# Patient Record
Sex: Male | Born: 1960 | Race: Black or African American | Hispanic: No
Health system: Southern US, Community
[De-identification: ages and names within clinical notes are randomized; demographics above are authoritative.]

---

## 2004-02-01 ENCOUNTER — Emergency Department: Payer: Self-pay | Admitting: General Practice

## 2005-02-01 ENCOUNTER — Other Ambulatory Visit: Payer: Self-pay

## 2005-02-01 ENCOUNTER — Emergency Department: Payer: Self-pay | Admitting: General Practice

## 2005-02-08 ENCOUNTER — Emergency Department: Payer: Self-pay | Admitting: Unknown Physician Specialty

## 2005-04-21 ENCOUNTER — Emergency Department: Payer: Self-pay | Admitting: Unknown Physician Specialty

## 2005-07-03 ENCOUNTER — Emergency Department: Payer: Self-pay | Admitting: Emergency Medicine

## 2005-10-21 ENCOUNTER — Emergency Department: Payer: Self-pay | Admitting: Emergency Medicine

## 2006-05-31 ENCOUNTER — Emergency Department: Payer: Self-pay | Admitting: Emergency Medicine
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
## 2007-10-28 ENCOUNTER — Emergency Department (HOSPITAL_COMMUNITY): Admission: EM | Admit: 2007-10-28 | Discharge: 2007-10-28 | Payer: Self-pay | Admitting: Emergency Medicine

## 2009-02-04 ENCOUNTER — Ambulatory Visit: Payer: Self-pay

## 2009-02-09 ENCOUNTER — Emergency Department: Payer: Self-pay | Admitting: Emergency Medicine

## 2009-03-04 ENCOUNTER — Emergency Department: Payer: Self-pay | Admitting: Emergency Medicine

## 2010-06-29 ENCOUNTER — Emergency Department: Payer: Self-pay | Admitting: Emergency Medicine

## 2011-05-04 ENCOUNTER — Emergency Department: Payer: Self-pay | Admitting: Emergency Medicine

## 2011-05-04 LAB — COMPREHENSIVE METABOLIC PANEL
Anion Gap: 11 (ref 7–16)
Calcium, Total: 8.6 mg/dL (ref 8.5–10.1)
Chloride: 104 mmol/L (ref 98–107)
EGFR (African American): >60
EGFR (Non-African Amer.): >60
Osmolality: 276 (ref 275–301)
Potassium: 3.6 mmol/L (ref 3.5–5.1)
SGOT(AST): 25 U/L (ref 15–37)
Sodium: 138 mmol/L (ref 136–145)
Total Protein: 7.8 g/dL (ref 6.4–8.2)

## 2011-05-04 LAB — URINALYSIS, COMPLETE
Bacteria: NONE SEEN
Bilirubin,UR: NEGATIVE
Blood: NEGATIVE
Glucose,UR: NEGATIVE mg/dL (ref 0–75)
Ph: 5 (ref 4.5–8.0)
Specific Gravity: 1.029 (ref 1.003–1.030)
Squamous Epithelial: 1
WBC UR: 28 /HPF (ref 0–5)

## 2011-05-04 LAB — DRUG SCREEN, URINE
Amphetamines, Ur Screen: NEGATIVE (ref ?–1000)
Barbiturates, Ur Screen: NEGATIVE (ref ?–200)
Benzodiazepine, Ur Scrn: NEGATIVE (ref ?–200)
Cocaine Metabolite,Ur ~~LOC~~: NEGATIVE (ref ?–300)
MDMA (Ecstasy)Ur Screen: NEGATIVE (ref ?–500)
Methadone, Ur Screen: NEGATIVE (ref ?–300)
Tricyclic, Ur Screen: NEGATIVE (ref ?–1000)

## 2011-05-04 LAB — CBC
HCT: 49.8 % (ref 40.0–52.0)
HGB: 16.4 g/dL (ref 13.0–18.0)
MCHC: 32.9 g/dL (ref 32.0–36.0)
MCV: 88 fL (ref 80–100)
RDW: 13.2 % (ref 11.5–14.5)

## 2011-05-04 LAB — ETHANOL: Ethanol: <3 mg/dL

## 2011-05-04 LAB — SALICYLATE LEVEL: Salicylates, Serum: 2 mg/dL

## 2011-05-24 ENCOUNTER — Inpatient Hospital Stay: Payer: Self-pay | Admitting: Psychiatry

## 2011-05-24 LAB — DRUG SCREEN, URINE
Cocaine Metabolite,Ur ~~LOC~~: NEGATIVE (ref ?–300)
MDMA (Ecstasy)Ur Screen: POSITIVE (ref ?–500)
Opiate, Ur Screen: NEGATIVE (ref ?–300)
Tricyclic, Ur Screen: NEGATIVE (ref ?–1000)

## 2011-05-24 LAB — CBC
HCT: 46.6 % (ref 40.0–52.0)
HGB: 15.4 g/dL (ref 13.0–18.0)
MCHC: 32.9 g/dL (ref 32.0–36.0)
RBC: 5.34 10*6/uL (ref 4.40–5.90)
WBC: 8.6 10*3/uL (ref 3.8–10.6)

## 2011-05-24 LAB — COMPREHENSIVE METABOLIC PANEL
Alkaline Phosphatase: 48 U/L — ABNORMAL LOW (ref 50–136)
BUN: 13 mg/dL (ref 7–18)
Bilirubin,Total: 0.7 mg/dL (ref 0.2–1.0)
Calcium, Total: 8.7 mg/dL (ref 8.5–10.1)
Chloride: 105 mmol/L (ref 98–107)
Co2: 23 mmol/L (ref 21–32)
Creatinine: 0.86 mg/dL (ref 0.60–1.30)
EGFR (African American): >60
EGFR (Non-African Amer.): >60
SGPT (ALT): 30 U/L
Sodium: 142 mmol/L (ref 136–145)
Total Protein: 7.5 g/dL (ref 6.4–8.2)

## 2011-05-24 LAB — ACETAMINOPHEN LEVEL: Acetaminophen: <2 ug/mL

## 2011-05-24 LAB — ETHANOL: Ethanol %: <0.003 % (ref 0.000–0.080)

## 2011-05-24 LAB — TSH: Thyroid Stimulating Horm: 0.85 u[IU]/mL

## 2011-05-26 LAB — URINALYSIS, COMPLETE
Glucose,UR: NEGATIVE mg/dL (ref 0–75)
Leukocyte Esterase: NEGATIVE
Ph: 6 (ref 4.5–8.0)
Protein: NEGATIVE
RBC,UR: NONE SEEN /HPF (ref 0–5)
Squamous Epithelial: NONE SEEN

## 2012-06-14 LAB — COMPREHENSIVE METABOLIC PANEL
Alkaline Phosphatase: 54 U/L (ref 50–136)
Anion Gap: 7 (ref 7–16)
BUN: 15 mg/dL (ref 7–18)
Calcium, Total: 8.7 mg/dL (ref 8.5–10.1)
Chloride: 105 mmol/L (ref 98–107)
Co2: 27 mmol/L (ref 21–32)
EGFR (Non-African Amer.): >60
Osmolality: 280 (ref 275–301)
SGPT (ALT): 61 U/L (ref 12–78)
Sodium: 139 mmol/L (ref 136–145)
Total Protein: 7.8 g/dL (ref 6.4–8.2)

## 2012-06-14 LAB — URINALYSIS, COMPLETE
Ketone: NEGATIVE
Nitrite: NEGATIVE
RBC,UR: 1 /HPF (ref 0–5)
Squamous Epithelial: NONE SEEN
WBC UR: 3 /HPF (ref 0–5)

## 2012-06-14 LAB — CBC
HCT: 44.1 % (ref 40.0–52.0)
HGB: 15.2 g/dL (ref 13.0–18.0)
MCH: 30 pg (ref 26.0–34.0)
MCV: 87 fL (ref 80–100)
Platelet: 235 10*3/uL (ref 150–440)
RDW: 14.1 % (ref 11.5–14.5)
WBC: 8 10*3/uL (ref 3.8–10.6)

## 2012-06-14 LAB — DRUG SCREEN, URINE
Amphetamines, Ur Screen: NEGATIVE (ref ?–1000)
Barbiturates, Ur Screen: NEGATIVE (ref ?–200)
MDMA (Ecstasy)Ur Screen: NEGATIVE (ref ?–500)
Methadone, Ur Screen: NEGATIVE (ref ?–300)
Phencyclidine (PCP) Ur S: NEGATIVE (ref ?–25)

## 2012-06-14 LAB — ACETAMINOPHEN LEVEL: Acetaminophen: <2 ug/mL

## 2012-06-14 LAB — ETHANOL: Ethanol %: <0.003 % (ref 0.000–0.080)

## 2012-06-17 ENCOUNTER — Inpatient Hospital Stay: Payer: Self-pay | Admitting: Psychiatry

## 2012-06-18 LAB — BEHAVIORAL MEDICINE 1 PANEL
Albumin: 3.1 g/dL — ABNORMAL LOW (ref 3.4–5.0)
Alkaline Phosphatase: 48 U/L — ABNORMAL LOW (ref 50–136)
Anion Gap: 10 (ref 7–16)
BUN: 11 mg/dL (ref 7–18)
Basophil #: 0 10*3/uL (ref 0.0–0.1)
Basophil %: 0.7 %
Bilirubin,Total: 0.4 mg/dL (ref 0.2–1.0)
Calcium, Total: 7.9 mg/dL — ABNORMAL LOW (ref 8.5–10.1)
Chloride: 108 mmol/L — ABNORMAL HIGH (ref 98–107)
Co2: 24 mmol/L (ref 21–32)
Creatinine: 0.67 mg/dL (ref 0.60–1.30)
Eosinophil #: 0.2 10*3/uL (ref 0.0–0.7)
Eosinophil %: 4.8 %
Glucose: 131 mg/dL — ABNORMAL HIGH (ref 65–99)
HCT: 40.6 % (ref 40.0–52.0)
HGB: 13.6 g/dL (ref 13.0–18.0)
Lymphocyte #: 1.7 10*3/uL (ref 1.0–3.6)
Lymphocyte %: 35.9 %
MCH: 29.2 pg (ref 26.0–34.0)
MCHC: 33.4 g/dL (ref 32.0–36.0)
MCV: 87 fL (ref 80–100)
Monocyte #: 0.2 x10 3/mm (ref 0.2–1.0)
Monocyte %: 5.2 %
Neutrophil #: 2.5 10*3/uL (ref 1.4–6.5)
Neutrophil %: 53.4 %
Osmolality: 284 (ref 275–301)
Platelet: 190 10*3/uL (ref 150–440)
Potassium: 3.7 mmol/L (ref 3.5–5.1)
RBC: 4.65 10*6/uL (ref 4.40–5.90)
RDW: 14.5 % (ref 11.5–14.5)
SGOT(AST): 23 U/L (ref 15–37)
SGPT (ALT): 45 U/L (ref 12–78)
Sodium: 142 mmol/L (ref 136–145)
Thyroid Stimulating Horm: 2.8 u[IU]/mL
Total Protein: 6.3 g/dL — ABNORMAL LOW (ref 6.4–8.2)
WBC: 4.6 10*3/uL (ref 3.8–10.6)

## 2012-06-18 LAB — URINALYSIS, COMPLETE
Bacteria: NONE SEEN
Bilirubin,UR: NEGATIVE
Blood: NEGATIVE
Glucose,UR: NEGATIVE mg/dL (ref 0–75)
Ketone: NEGATIVE
Nitrite: NEGATIVE
Ph: 7 (ref 4.5–8.0)
Protein: NEGATIVE
RBC,UR: 1 /HPF (ref 0–5)
Specific Gravity: 1.016 (ref 1.003–1.030)
Squamous Epithelial: <1
WBC UR: 5 /HPF (ref 0–5)

## 2012-07-01 LAB — CBC
HCT: 46 % (ref 40.0–52.0)
MCH: 29.2 pg (ref 26.0–34.0)
MCHC: 32.9 g/dL (ref 32.0–36.0)
Platelet: 179 10*3/uL (ref 150–440)
RBC: 5.2 10*6/uL (ref 4.40–5.90)
WBC: 5.3 10*3/uL (ref 3.8–10.6)

## 2012-07-01 LAB — COMPREHENSIVE METABOLIC PANEL
Albumin: 3.8 g/dL (ref 3.4–5.0)
Alkaline Phosphatase: 49 U/L — ABNORMAL LOW (ref 50–136)
Anion Gap: 5 — ABNORMAL LOW (ref 7–16)
Bilirubin,Total: 0.4 mg/dL (ref 0.2–1.0)
Co2: 27 mmol/L (ref 21–32)
Creatinine: 0.61 mg/dL (ref 0.60–1.30)
EGFR (Non-African Amer.): >60
Potassium: 4 mmol/L (ref 3.5–5.1)
SGOT(AST): 38 U/L — ABNORMAL HIGH (ref 15–37)
SGPT (ALT): 74 U/L (ref 12–78)
Sodium: 141 mmol/L (ref 136–145)

## 2012-07-01 LAB — URINALYSIS, COMPLETE
Bacteria: NONE SEEN
Bilirubin,UR: NEGATIVE
Glucose,UR: NEGATIVE mg/dL (ref 0–75)
Ketone: NEGATIVE
Nitrite: NEGATIVE
WBC UR: 10 /HPF (ref 0–5)

## 2012-07-01 LAB — DRUG SCREEN, URINE
Amphetamines, Ur Screen: NEGATIVE (ref ?–1000)
Barbiturates, Ur Screen: NEGATIVE (ref ?–200)
Cocaine Metabolite,Ur ~~LOC~~: NEGATIVE (ref ?–300)
Phencyclidine (PCP) Ur S: NEGATIVE (ref ?–25)
Tricyclic, Ur Screen: NEGATIVE (ref ?–1000)

## 2012-07-01 LAB — ETHANOL: Ethanol: <3 mg/dL

## 2012-07-01 LAB — TSH: Thyroid Stimulating Horm: 3.14 u[IU]/mL

## 2012-07-03 ENCOUNTER — Inpatient Hospital Stay: Payer: Self-pay | Admitting: Psychiatry

## 2012-10-11 ENCOUNTER — Emergency Department: Payer: Self-pay | Admitting: Emergency Medicine

## 2012-10-12 ENCOUNTER — Emergency Department: Payer: Self-pay | Admitting: Emergency Medicine

## 2012-10-12 LAB — COMPREHENSIVE METABOLIC PANEL
Albumin: 3.2 g/dL — ABNORMAL LOW (ref 3.4–5.0)
Alkaline Phosphatase: 60 U/L (ref 50–136)
Anion Gap: 7 (ref 7–16)
Calcium, Total: 8.6 mg/dL (ref 8.5–10.1)
Chloride: 103 mmol/L (ref 98–107)
Co2: 28 mmol/L (ref 21–32)
EGFR (African American): >60
EGFR (Non-African Amer.): >60
SGOT(AST): 41 U/L — ABNORMAL HIGH (ref 15–37)
SGPT (ALT): 60 U/L (ref 12–78)
Sodium: 138 mmol/L (ref 136–145)
Total Protein: 7.5 g/dL (ref 6.4–8.2)

## 2012-10-12 LAB — CBC
HCT: 45.6 % (ref 40.0–52.0)
MCH: 29.5 pg (ref 26.0–34.0)
MCHC: 34.4 g/dL (ref 32.0–36.0)
MCV: 86 fL (ref 80–100)
Platelet: 212 10*3/uL (ref 150–440)
RDW: 12.8 % (ref 11.5–14.5)

## 2013-01-13 ENCOUNTER — Emergency Department: Payer: Self-pay | Admitting: Emergency Medicine

## 2013-01-13 LAB — CBC
HCT: 47.2 % (ref 40.0–52.0)
HGB: 16.1 g/dL (ref 13.0–18.0)
MCH: 30 pg (ref 26.0–34.0)
MCV: 88 fL (ref 80–100)
RBC: 5.35 10*6/uL (ref 4.40–5.90)
RDW: 13.5 % (ref 11.5–14.5)
WBC: 7 10*3/uL (ref 3.8–10.6)

## 2013-01-13 LAB — TSH: Thyroid Stimulating Horm: 2.63 u[IU]/mL

## 2013-01-13 LAB — URINALYSIS, COMPLETE
Blood: NEGATIVE
Ketone: NEGATIVE
Leukocyte Esterase: NEGATIVE
Nitrite: NEGATIVE
RBC,UR: 1 /HPF (ref 0–5)
Specific Gravity: 1.016 (ref 1.003–1.030)
Squamous Epithelial: NONE SEEN

## 2013-01-13 LAB — DRUG SCREEN, URINE
Barbiturates, Ur Screen: NEGATIVE (ref ?–200)
Cannabinoid 50 Ng, Ur ~~LOC~~: POSITIVE (ref ?–50)
MDMA (Ecstasy)Ur Screen: NEGATIVE (ref ?–500)
Opiate, Ur Screen: NEGATIVE (ref ?–300)
Phencyclidine (PCP) Ur S: NEGATIVE (ref ?–25)
Tricyclic, Ur Screen: NEGATIVE (ref ?–1000)

## 2013-01-13 LAB — COMPREHENSIVE METABOLIC PANEL
Albumin: 4 g/dL (ref 3.4–5.0)
Anion Gap: 7 (ref 7–16)
BUN: 14 mg/dL (ref 7–18)
Creatinine: 0.77 mg/dL (ref 0.60–1.30)
EGFR (Non-African Amer.): >60
Osmolality: 277 (ref 275–301)
Potassium: 4.2 mmol/L (ref 3.5–5.1)
Total Protein: 7.6 g/dL (ref 6.4–8.2)

## 2013-11-17 DEATH — deceased

## 2014-06-28 IMAGING — CR DG TIBIA/FIBULA 2V*L*
1 series · 4 of 4 positions shown · non-contrast
Comparison: none

REASON FOR EXAM: swelling, no injury
COMMENTS:

PROCEDURE:     DXR - DXR TIBIA AND FIBULA LT (LOWER L  - October 12, 2012  [DATE]
RESULT:

[Series 2: x tib-fib ap left · 0.14mm/px · 4 of 4 slices shown]
[im 1/4]
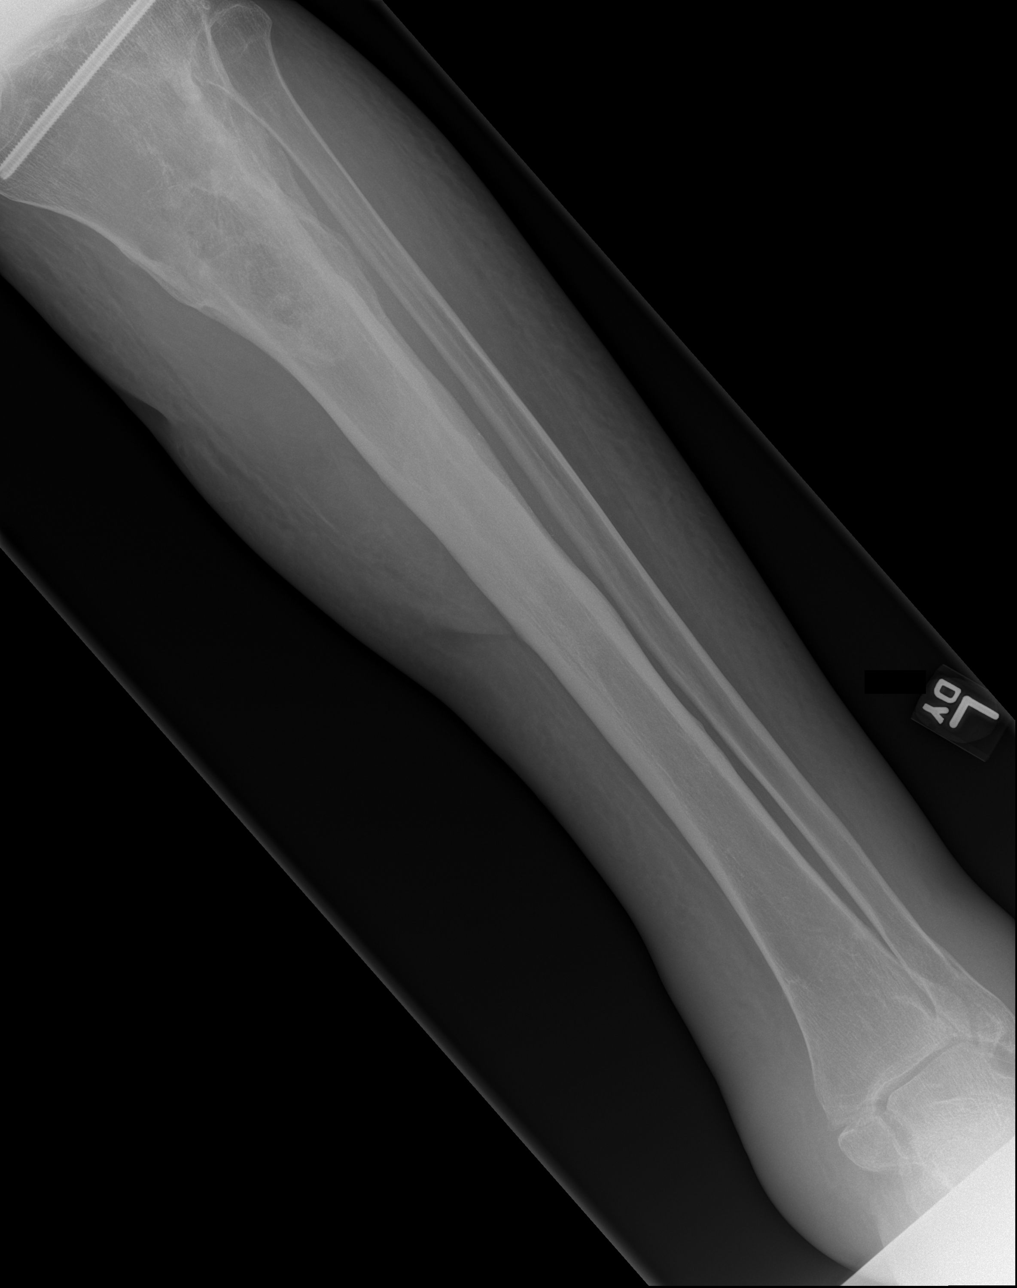
[im 2/4]
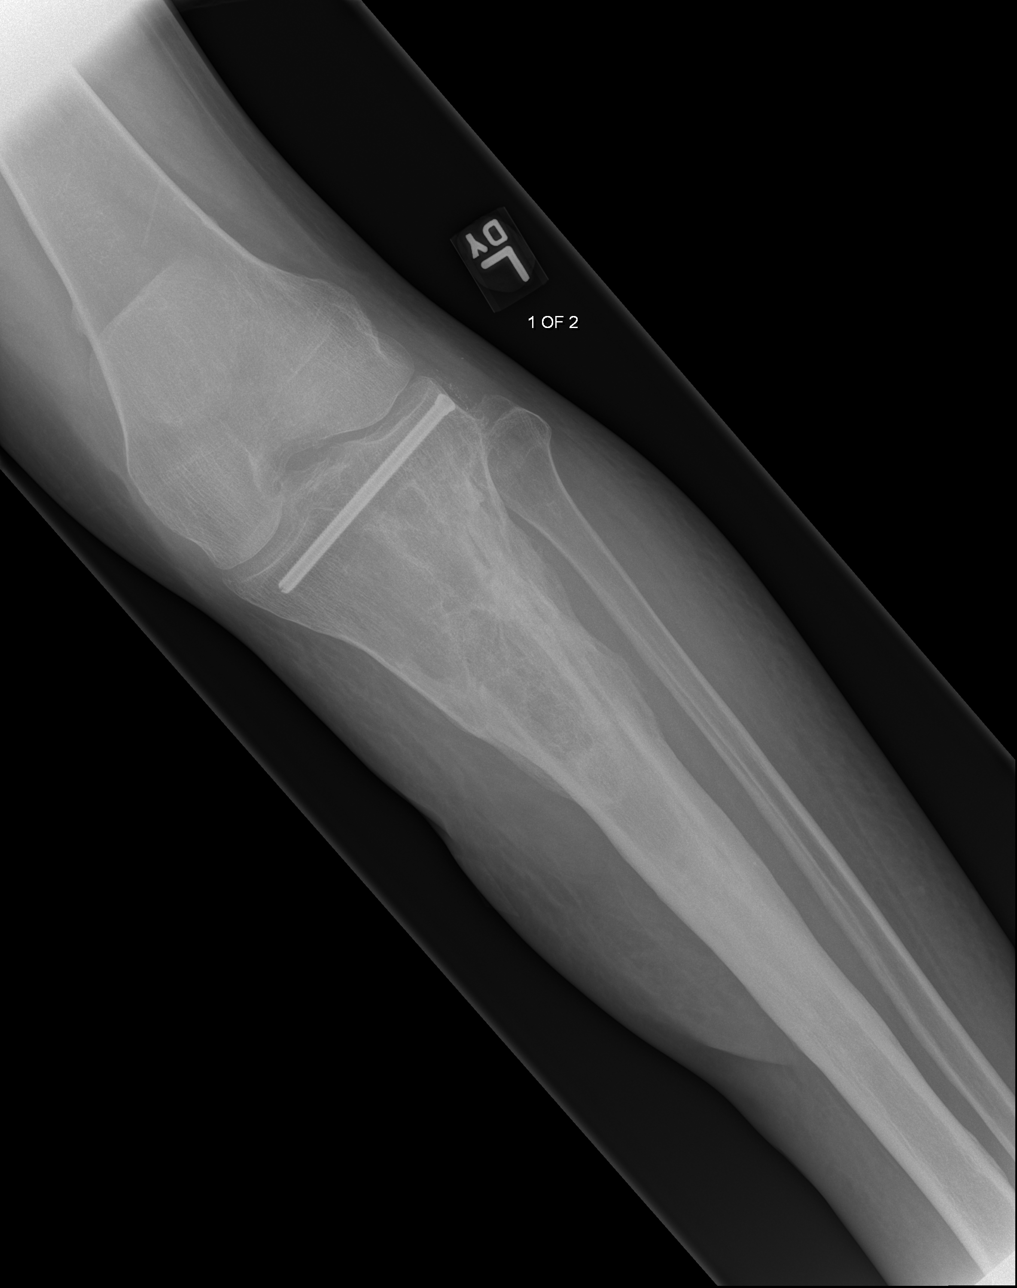
[im 3/4]
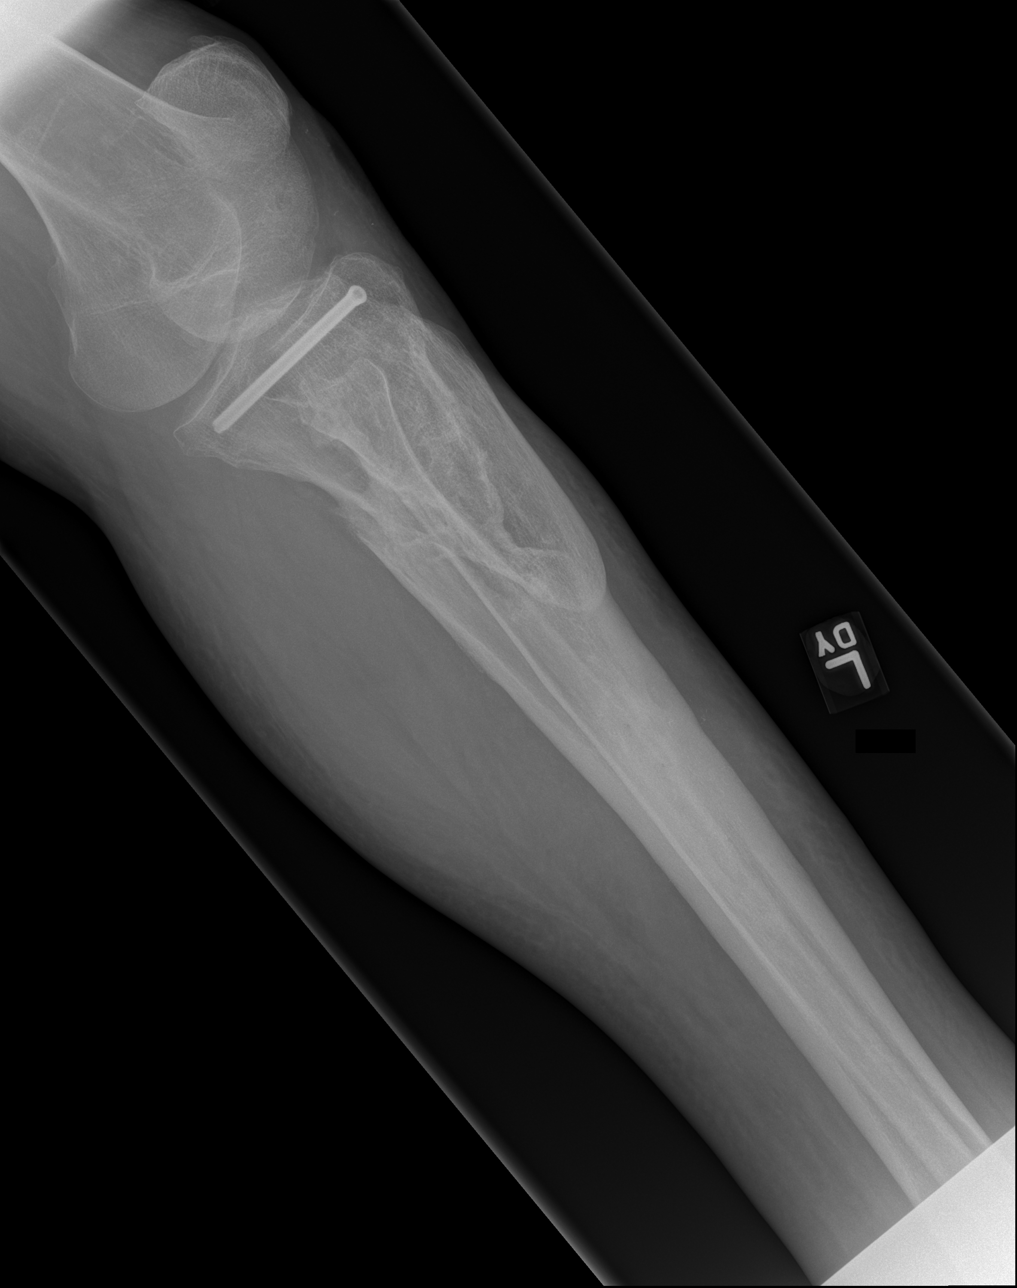
[im 4/4]
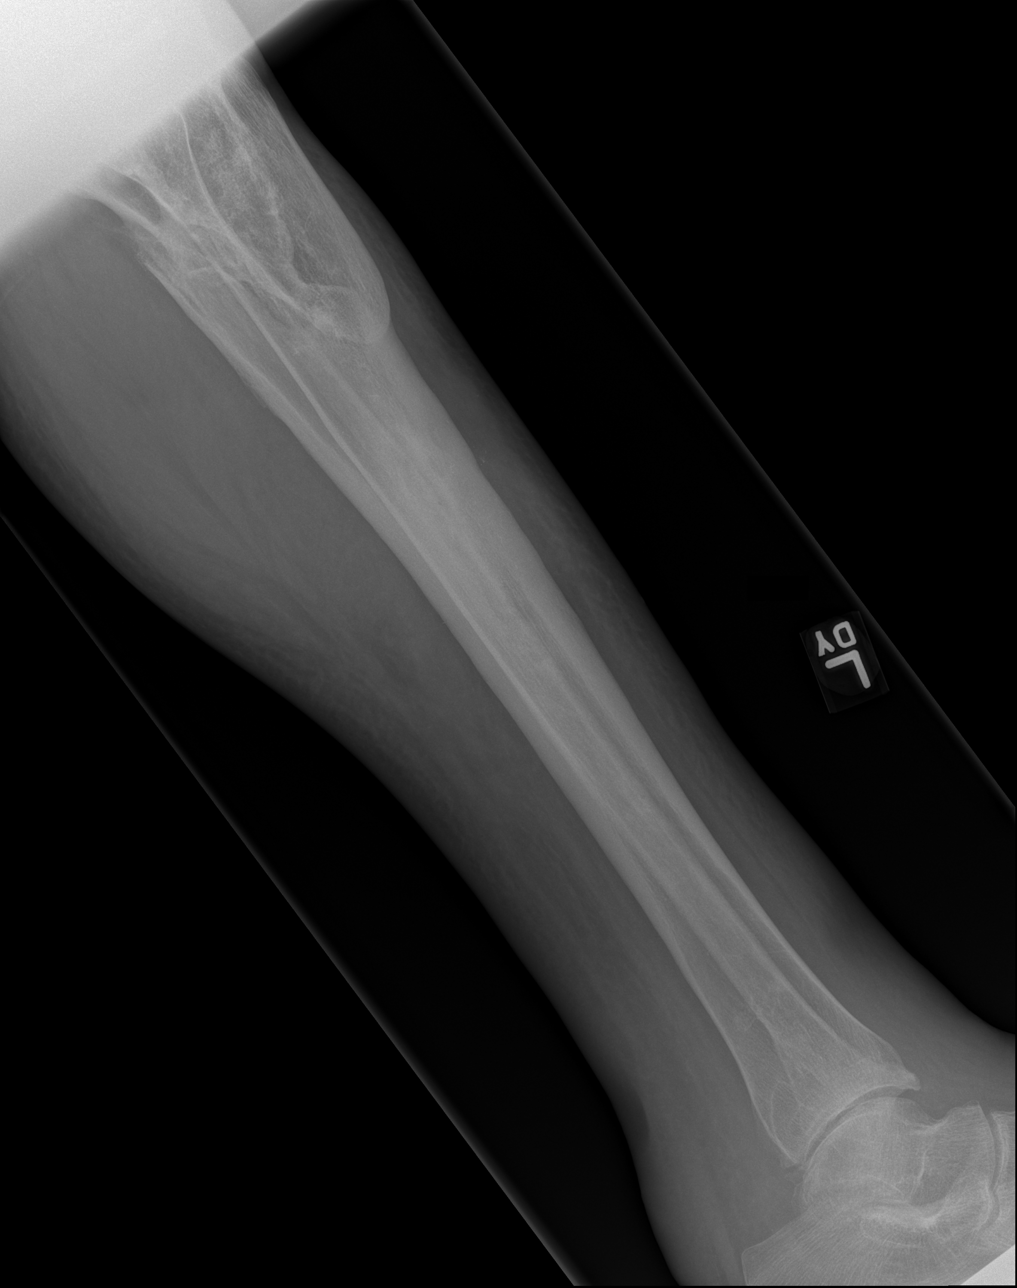

[4 of 4 positions shown; findings below may reference images not displayed]

FINDINGS: Chronic change is identified within the proximal tibia and
fibular. Chronic changes are also identified distally. An acute on chronic
fracture distally cannot be excluded. There is diffuse soft tissue swelling.
IMPRESSION: 1. Chronic changes. Acute on chronic fracture distally cannot be excluded.
Surveillance evaluation recommended.

## 2014-06-28 IMAGING — CR DG ANKLE COMPLETE 3+V*L*
1 series · 4 of 4 positions shown · non-contrast
Comparison: none

REASON FOR EXAM: swelling, no injury
COMMENTS:

PROCEDURE:     DXR - DXR ANKLE LEFT COMPLETE  - October 12, 2012  [DATE]
RESULT:

[Series 2: x ankle ap left · 0.14mm/px · 4 of 4 slices shown]
[im 1/4]
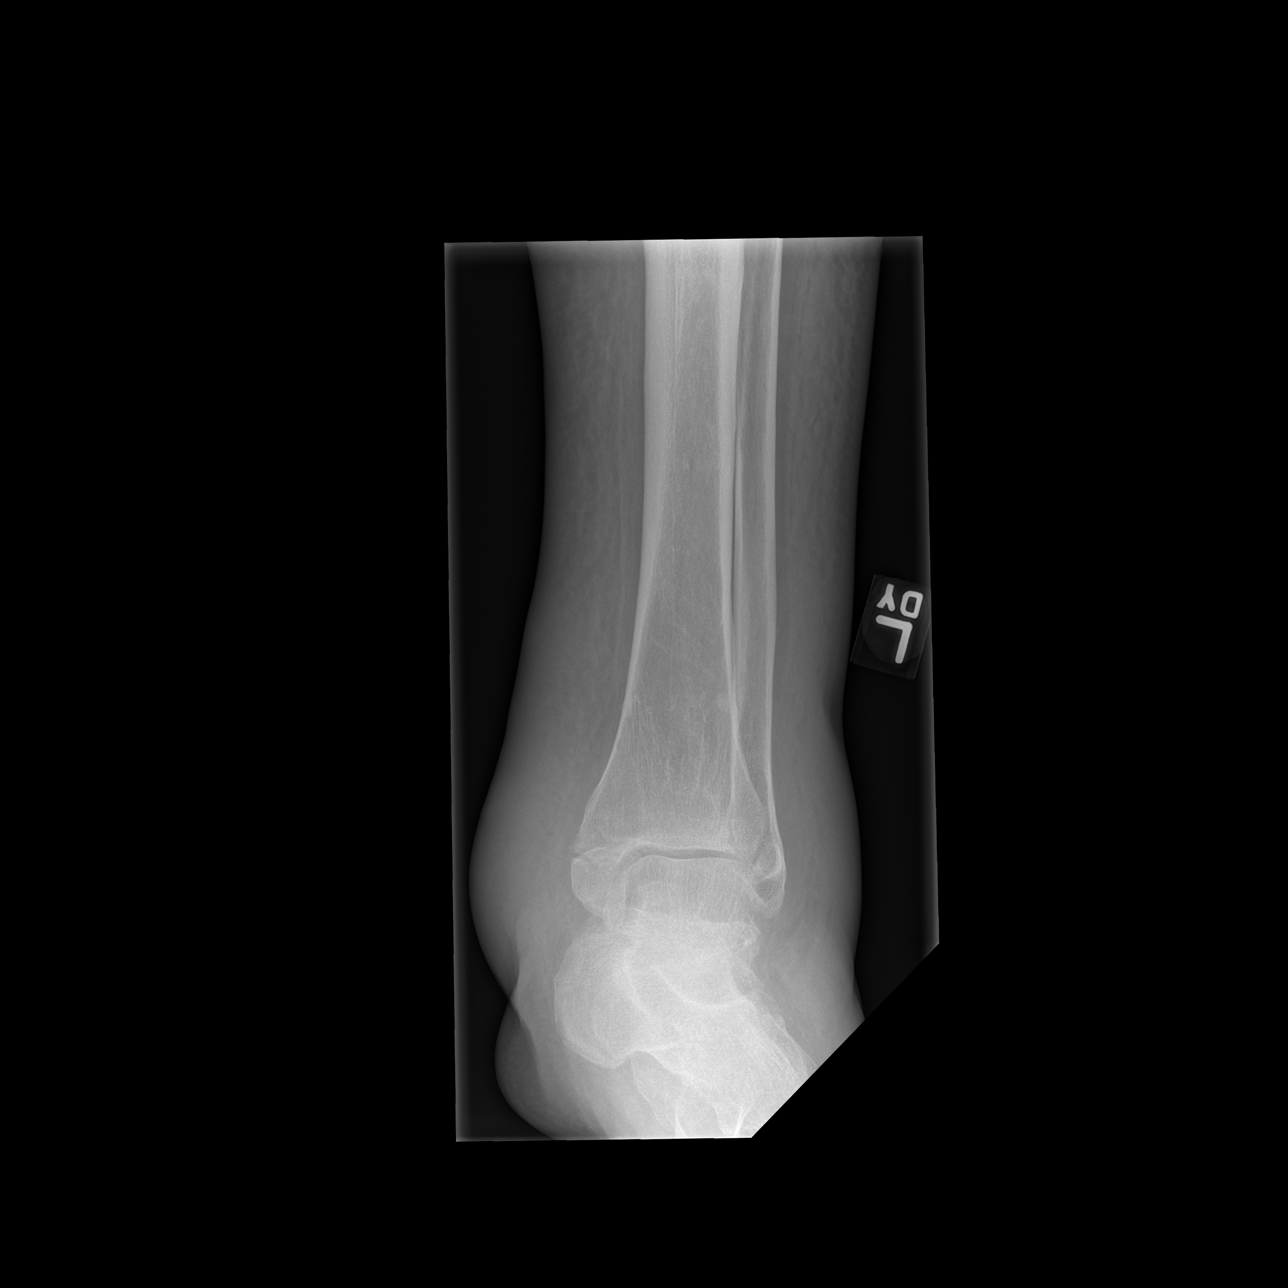
[im 2/4]
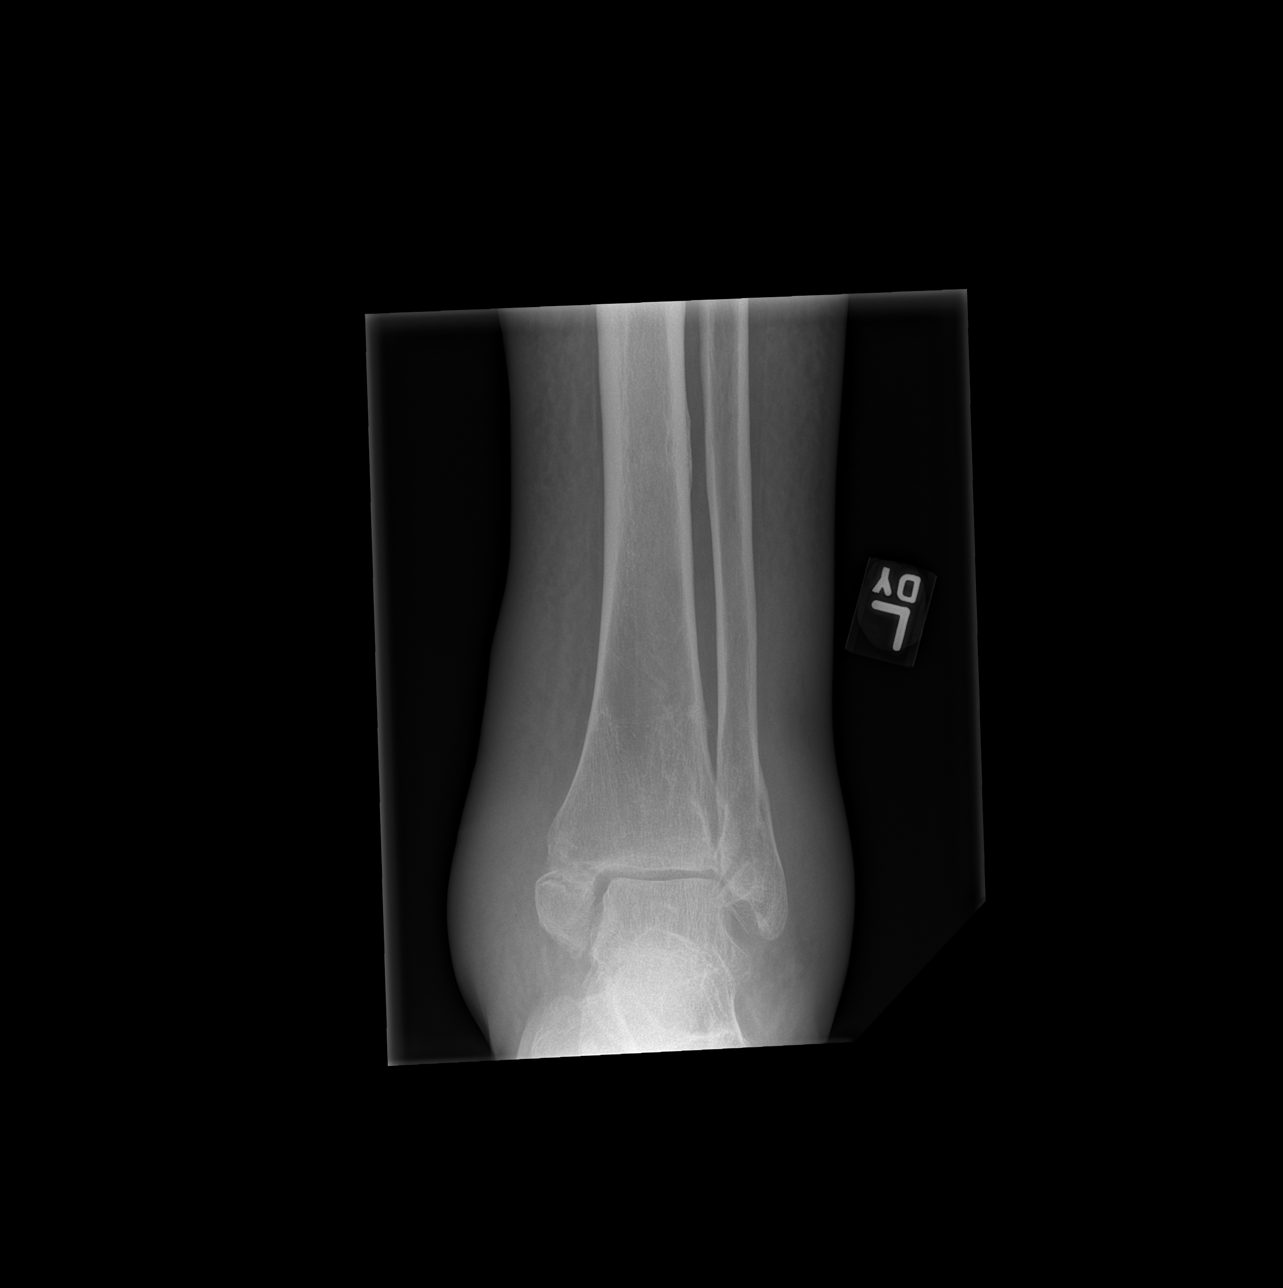
[im 3/4]
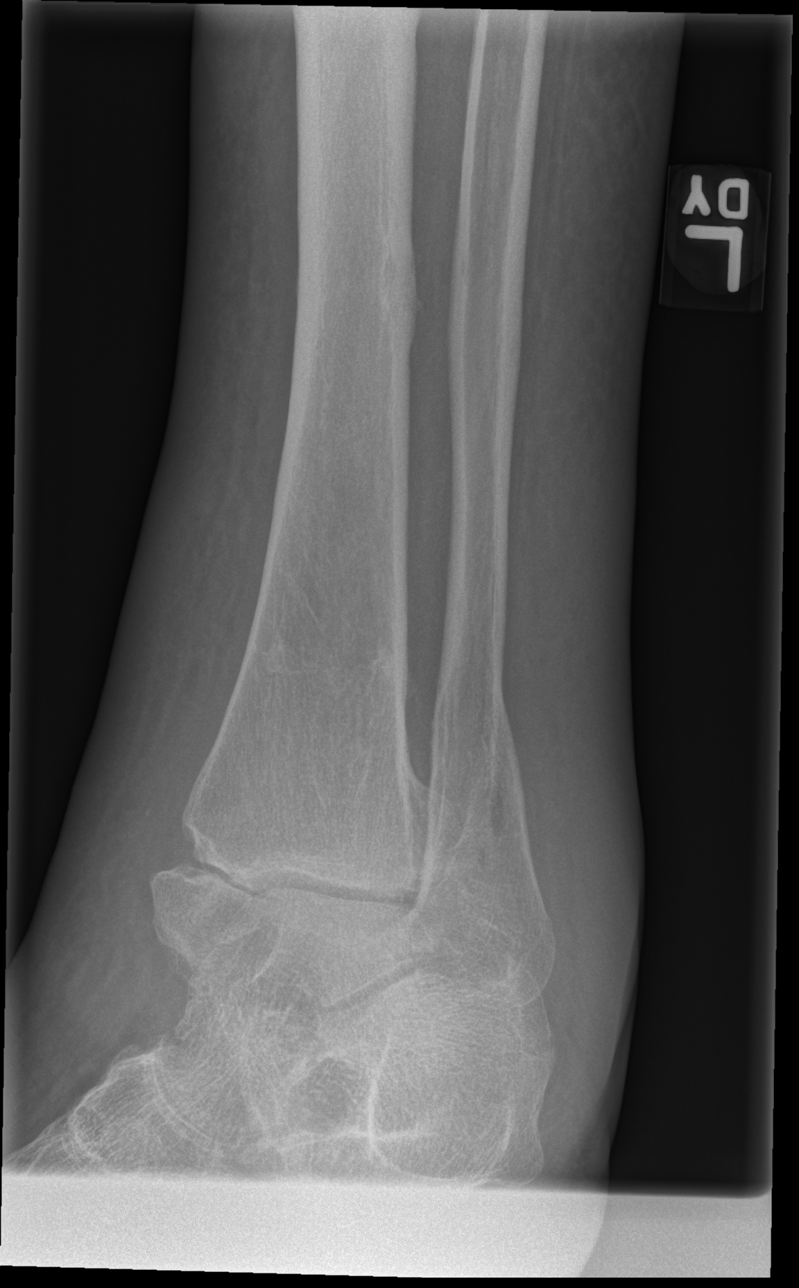
[im 4/4]
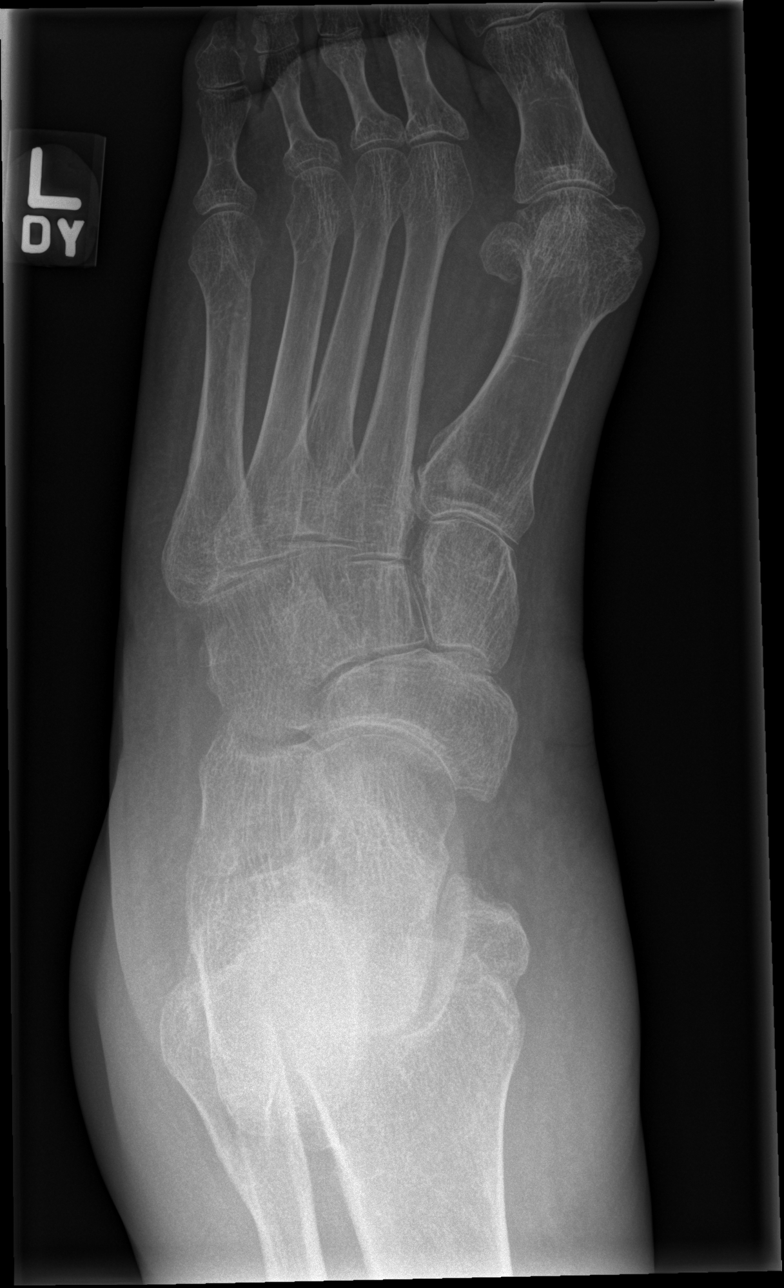

[4 of 4 positions shown; findings below may reference images not displayed]

FINDINGS: Soft tissue swelling is identified within the medial and lateral
malleolar regions. The bones are osteopenic. A chronic medial malleolar
fracture is identified. An acute on chronic component cannot be excluded.
There are also findings which may represent an acute on chronic lateral
malleolar fracture.
IMPRESSION: 1. Chronic medial malleolar fracture.
2. Findings which may represent acute on chronic injury within the medial
and lateral malleolar regions.
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
## 2014-07-09 NOTE — Consult Note (Signed)
Brief Consult Note: Diagnosis: schizophrenia.   Patient was seen by consultant.   Recommend further assessment or treatment.   Orders entered.   Comments: Psychiatry: Patient seen. Well known to us. Recent discharge after stabilization for psychosis. Returns paranoid, delusional and vauguely threatening. Will try getting him some medication before considering his admition.  Electronic Signatures: Makena Murdock, Jackquline DenmarkJohn T (MD)  (Signed 16-Apr-14 15:51)  Authored: Brief Consult Note   Last Updated: 16-Apr-14 15:51 by Audery Amellapacs, Pamella Samons T (MD)
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
## 2014-07-09 NOTE — Discharge Summary (Signed)
PATIENT NAME:  Barry Nguyen, Barry Nguyen MR#:  [id number] DATE OF BIRTH:  Aug 12, 1960  DATE OF ADMISSION:  06/17/2012 DATE OF DISCHARGE:  06/25/2012  HOSPITAL COURSE: See dictated history and physical for details of admission. This 54 year old man has a history of schizophrenia and had recently gotten out of jail after doing at least several months. During that time, it appears that he probably was not on his medication. When he came home to his mother's house, he immediately was demonstrating psychotic, disturbing behavior to an extent that was frightening to his mother and was not agreeable to outpatient treatment. This was the background to his being admitted to the hospital here. In the hospital, the patient was treated with Risperdal. This is an antipsychotic that he had responded to well in the past. The patient appeared to be compliant with medication and did not protest about this. He did not complain of any side effects. Gradually, he seemed to become less guarded and was able to carry on more of a conversation. On April 5th, he agreed at my suggestion to allow a Risperdal Decanoate shot to be given. Risperdal Consta 37.5 mg was administered on April 5th. The patient continued to take oral Risperdal. Towards the end of his hospital stay, he became much more appropriately interactive. He came out of his room, attended some groups. He still did not talk very much, but he appeared to be less guarded and was able to have at least a fairly appropriate conversation with me, free of psychotic symptoms at the time of discharge. He was educated about his illness and the importance of staying on medication. He was agreeable to going back and staying at his mother's house. He was not displaying any dangerous behavior. [facility]  DISCHARGE MENTAL STATUS EXAMINATION: Casually dressed, reasonably well groomed man, looks his stated age. Cooperative with the interview. Passive, however. Good eye contact.  Psychomotor activity appropriate. Speech is decreased in total amount and a little bit flat in tone. Affect overall is flat but does not appear to be hostile. Mood is stated as fine. Thoughts are showing thought blocking, slowing and guardedness, but he does not make any obviously paranoid statements. Denies that he is having thought insertion. Denies hallucinations. Denies suicidal or homicidal ideation. Shows improved insight and judgment and is agreeable to staying on his medicine.   DISCHARGE MEDICATIONS: Risperdal 3 mg twice a day.   LABORATORY RESULTS: Around the time of admission, labs were done in the emergency room, showed a chemistry panel all normal except for a glucose elevated at 135 on a non-fasting draw. Acetaminophen negative. Alcohol negative. CBC normal. TSH normal at 2.1. Drug screen positive for cannabis. Salicylates normal at 2.2. Urine analysis showed positive leukocyte esterase quite possibly related to an STD or a urinary tract infection.   DISCHARGE DIAGNOSES: Principal and primary:  AXIS I: Schizophrenia undifferentiated.   SECONDARY DIAGNOSES:  AXIS I: Marijuana abuse.  AXIS II: Deferred.  AXIS III: Urinary tract infection, presumed resolved.  AXIS IV: Moderate to severe from not really having a stable place to live, chronic burden of illness.  AXIS V: Functioning at time of discharge 57.    ____________________________ Audery AmelJohn T. Joie Hipps, MD jtc:es D: 07/03/2012 15:21:14 ET T: 07/03/2012 15:55:47 ET JOB#: [id number]  cc: Audery AmelJohn T. Laurisa Sahakian, MD, <Dictator> Audery AmelJOHN T Jesilyn Easom MD ELECTRONICALLY SIGNED 07/04/2012 16:58

## 2014-07-09 NOTE — Discharge Summary (Signed)
PATIENT NAME:  Barry Nguyen, Barry Nguyen MR#:  [id number] DATE OF BIRTH:  1960-06-05  DATE OF ADMISSION:  07/03/2012  DATE OF DISCHARGE:  07/09/2012  HOSPITAL COURSE:  See dictated history and physical for details of admission. This 54 year old man with a history of schizophrenia was brought back to the hospital after displaying psychotic behavior at home. Initially he was hostile and paranoid, but he did take medication in the Emergency Room and was brought to the unit. He was able to come out of his room and be around other clients. He attended some groups, but with minimal participation. He was still defensive and a bit paranoid, but did not make any threatening or dangerous statements, did not engage in any behavior dangerous to himself. He took oral medication. He complained of the Risperdal because he was afraid of growing breast material, and so he was changed over to Haldol. I asked him to please take the Haldol Decanoate shot, which he declined. At the time of discharge, the patient appears to be at his baseline. Denies thought broadcasting. Denies hallucinations. Denies suicidal or homicidal ideation. States that he intends to go back and live with his family, and agrees to stay on medication.   LABORATORY RESULTS:  Admission labs showed drug screen that was negative. TSH normal at 3.1. Alcohol undetected. Chemistry shows elevated glucose 128, chloride slightly elevated 109, alkaline phosphatase low 48. CBC unremarkable.   DISCHARGE MEDICATIONS:  Haldol 5 mg b.i.d.   MENTAL STATUS EXAMINATION:  A neatly groomed and dressed man who looks his stated age. Passively cooperative. Good eye contact. Slow psychomotor activity. Speech decreased in total amount, but normal in tone. Affect blunted, not threatening. Mood stated as being fine. Thoughts appear slow, somewhat concrete. No obvious delusions. Denies hallucinations. Denies suicidal or homicidal ideation. Short and long-term memory seemed to be grossly  intact. Average intelligence.   DISPOSITION:  Discharge home to stay with his family. Follow-up arrangements to be made through [facility].    DIAGNOSIS, PRINCIPAL AND PRIMARY:   AXIS I:  Schizophrenia, paranoid type.   SECONDARY DIAGNOSES: AXIS I:  Cannabis abuse.  AXIS II:  Deferred.  AXIS III:  No diagnosis.  AXIS IV:  Moderate to severe from lack of social support, recent imprisonment.  AXIS V:  Functioning at time of discharge 57.    ____________________________ Audery AmelJohn T. Clapacs, MD jtc:mr D: 07/10/2012 15:38:00 ET T: 07/10/2012 19:27:41 ET JOB#: [id number]  cc: Audery AmelJohn T. Clapacs, MD, <Dictator> Audery AmelJOHN T CLAPACS MD ELECTRONICALLY SIGNED 07/14/2012 22:58
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
## 2014-07-11 NOTE — Discharge Summary (Signed)
PATIENT NAME:  Barry Nguyen, EARWOOD MR#:  [id number] DATE OF BIRTH:  [birth year]  DATE OF ADMISSION:  05/24/2011 DATE OF DISCHARGE:  06/06/2011  HOSPITAL COURSE: See dictated History and Physical for details of admission. This 54 year old man with a history of schizophrenia was brought to the Emergency Room under involuntary petition. His family identified that he had been increasingly disorganized and agitated, and they were concerned about his worsening psychosis and possible noncompliance with medicine. He had discontinued his decanoate shots some time prior to hospitalization and may have been noncompliant with psychiatric medicine after that. In the hospital, the patient was fairly reclusive most of the time. For several days he stayed in his bedroom, making very little contact with other patient's or staff. He did take his medication but did not show a great deal of improvement. He continued to state that he was building a house somewhere in [place] and also showed very poor insight. Although he did not make any attempt to spontaneously be aggressive, he did make some aggressive statements to his physician on a couple of occasions when I went to speak with him. He became paranoid on a couple of occasions. Eventually, however, the patient did seem to respond to medicine. He asked if he could be discharged, and he met with his sister and finally did agree to go back and stay at his mother's house and was showing better insight. The patient was told once his commitment hearing had gone through that we would be happy to discharge him if he would take a Risperdal decanoate shot. He hesitated for two days but then agreed to take the shot. After that, he was discharged with follow-up to be arranged with his previous outpatient provider in the community and to stay at his mother's house.   LABORATORY RESULTS: Urinalysis showed ketones, no cell count. Drug screen was positive for MDMA. TSH was normal. Salicylates  were only slightly high at 2.9. CBC was normal. Alcohol was undetectable. Chemistry showed a low alkaline phosphatase at 48, elevated glucose at 102.   DISCHARGE MEDICATIONS:  1. Risperdal Consta long acting shot 37.5 mg every 2 weeks, next dose due about April 3rd. Risperdal oral disintegrating tablets, 3 mg b.i.d.    MENTAL STATUS EXAM: Normal eye contact. Normal psychomotor activity. Speech decreased in total amount, some poverty of thought. Normal rate and tone. Affect blunted. Mood stated as okay. Thoughts appear concrete but not disorganized or bizarre. He denied hallucinations. He denied suicidal or homicidal ideation. Indicated improved insight and judgment.   DIAGNOSIS, PRINCIPAL AND PRIMARY:  AXIS I: Schizophrenia, disorganized type.   SECONDARY DIAGNOSES:  AXIS I: No further diagnosis.   AXIS II: Deferred.   AXIS III: No diagnosis.   AXIS IV: Moderate-to-severe. Chronic stress from discord with family and lack of stable social situation.   AXIS V: Functioning at time of discharge 57.    ____________________________ Gonzella Lex, MD jtc:cbb D: 06/19/2011 18:07:18 ET T: 06/19/2011 18:40:14 ET JOB#: [id number]  cc: Gonzella Lex, MD, <Dictator> Gonzella Lex MD ELECTRONICALLY SIGNED 06/20/2011 17:43
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
## 2014-07-11 NOTE — Consult Note (Signed)
Brief Consult Note: Diagnosis: Paranoid schizophrenia.   Patient was seen by consultant.   Orders entered.   Discussed with Attending MD.   Comments: Barry Nguyen has a h/o schizophrenia with [facility] He stopped taking anntipsychotics in 2011. He was brought to ER floridly psychotic.   PLAN: 1. It is unclear if the patient received his Risperdal Consta shot in the ER this weekend.   2. If not, I will start Invega sustenna for its quick onset.  3. The patient is waiting for [facility] bed.   4. I will follow up.  Electronic Signatures: Kristine LineaPucilowska, Nyimah Shadduck (MD)  (Signed 18-Feb-13 13:21)  Authored: Brief Consult Note   Last Updated: 18-Feb-13 13:21 by Kristine LineaPucilowska, Izzak Fries (MD)
